# Patient Record
Sex: Male | Born: 1960 | Race: White | Hispanic: No | Marital: Married | Smoking: Former smoker
Health system: Southern US, Community
[De-identification: ages and names within clinical notes are randomized; demographics above are authoritative.]

---

## 2001-05-30 ENCOUNTER — Encounter: Admission: RE | Admit: 2001-05-30 | Discharge: 2001-05-30 | Payer: Self-pay | Admitting: Neurosurgery

## 2001-05-30 ENCOUNTER — Encounter: Payer: Self-pay | Admitting: Neurosurgery

## 2013-09-11 ENCOUNTER — Ambulatory Visit: Payer: Self-pay

## 2013-12-03 ENCOUNTER — Ambulatory Visit: Payer: Self-pay | Admitting: Surgery

## 2013-12-03 LAB — BASIC METABOLIC PANEL
Anion Gap: 6 — ABNORMAL LOW (ref 7–16)
BUN: 13 mg/dL (ref 7–18)
Calcium, Total: 8.8 mg/dL (ref 8.5–10.1)
Chloride: 108 mmol/L — ABNORMAL HIGH (ref 98–107)
Co2: 25 mmol/L (ref 21–32)
Creatinine: 0.91 mg/dL (ref 0.60–1.30)
EGFR (African American): >60
EGFR (Non-African Amer.): >60
Glucose: 122 mg/dL — ABNORMAL HIGH (ref 65–99)
Osmolality: 279 (ref 275–301)
Potassium: 3.8 mmol/L (ref 3.5–5.1)
Sodium: 139 mmol/L (ref 136–145)

## 2013-12-03 LAB — HEPATIC FUNCTION PANEL A (ARMC)
Albumin: 3.8 g/dL (ref 3.4–5.0)
Alkaline Phosphatase: 46 U/L
Bilirubin, Direct: <0.1 mg/dL (ref 0.00–0.20)
Bilirubin,Total: 0.4 mg/dL (ref 0.2–1.0)
SGOT(AST): 24 U/L (ref 15–37)
SGPT (ALT): 36 U/L (ref 12–78)
Total Protein: 6.6 g/dL (ref 6.4–8.2)

## 2013-12-03 LAB — CBC WITH DIFFERENTIAL/PLATELET
Basophil #: 0 10*3/uL (ref 0.0–0.1)
Basophil %: 0.6 %
Eosinophil #: 0.1 10*3/uL (ref 0.0–0.7)
Eosinophil %: 1.7 %
HCT: 43 % (ref 40.0–52.0)
HGB: 14.7 g/dL (ref 13.0–18.0)
Lymphocyte #: 1 10*3/uL (ref 1.0–3.6)
Lymphocyte %: 15.4 %
MCH: 29.2 pg (ref 26.0–34.0)
MCHC: 34.1 g/dL (ref 32.0–36.0)
MCV: 86 fL (ref 80–100)
Monocyte #: 0.4 x10 3/mm (ref 0.2–1.0)
Monocyte %: 6.2 %
Neutrophil #: 4.8 10*3/uL (ref 1.4–6.5)
Neutrophil %: 76.1 %
Platelet: 205 10*3/uL (ref 150–440)
RBC: 5.02 10*6/uL (ref 4.40–5.90)
RDW: 13.3 % (ref 11.5–14.5)
WBC: 6.4 10*3/uL (ref 3.8–10.6)

## 2013-12-03 LAB — PROTIME-INR
INR: 1
Prothrombin Time: 12.6 secs (ref 11.5–14.7)

## 2013-12-10 ENCOUNTER — Ambulatory Visit: Payer: Self-pay | Admitting: Surgery

## 2013-12-12 ENCOUNTER — Other Ambulatory Visit: Payer: Self-pay | Admitting: Surgery

## 2013-12-12 LAB — ALBUMIN: Albumin: 3.6 g/dL (ref 3.4–5.0)

## 2013-12-12 LAB — CALCIUM: Calcium, Total: 8.6 mg/dL (ref 8.5–10.1)

## 2013-12-19 ENCOUNTER — Other Ambulatory Visit: Payer: Self-pay | Admitting: Surgery

## 2013-12-19 LAB — PATHOLOGY REPORT

## 2013-12-19 LAB — CALCIUM: Calcium, Total: 9.3 mg/dL (ref 8.5–10.1)

## 2014-03-08 DIAGNOSIS — E210 Primary hyperparathyroidism: Secondary | ICD-10-CM | POA: Insufficient documentation

## 2015-03-01 NOTE — Op Note (Signed)
PATIENT NAME:  John Hahn, [name] MR#:  [id number] DATE OF BIRTH:  06-Oct-1961  DATE OF PROCEDURE:  12/10/2013  PREOPERATIVE DIAGNOSIS: Primary hyperparathyroidism from right inferior parathyroid adenoma.  POSTOPERATIVE DIAGNOSIS: Primary hyperparathyroidism from right inferior parathyroid adenoma (1700 mg).   PROCEDURE: Right inferior parathyroidectomy.   SURGEON: Claude MangesWilliam F. Sorina Derrig, MD  FIRST ASSISTANT: Ida Roguehristopher Lundquist, MD  ANESTHESIA: General.   PROCEDURE IN DETAIL: The patient was placed supine on the operating room table and prepped and draped in the usual sterile fashion. A limited incision was made over the anterior border of the sternocleidomastoid muscle, in the neck crease, 2 fingerbreadths above the right clavicle, and this was carried down through the platysma muscle with electrocautery. Subplatysmal flaps were created superiorly and inferiorly and then the anterior border of the sternocleidomastoid was dissected and retracted laterally. The omohyoid was dissected and retracted superiorly, and the lateral edge of the strap muscles was dissected and retracted medially exposing the parathyroid adenoma. It was quite large and extremely firm. There was a significant amount of scar tissue surrounding the entire gland. The gland was attached to the lower pole of the thyroid gland, and the scar tissue emanated in all directions. The gland was tediously dissected out from its location and small hemoclips were placed on individual vessels. The right recurrent laryngeal nerve was never expressly identified, but was not thought to have been injured during the dissection. Hemostasis was achieved at the end of the procedure and was excellent and therefore the platysma was closed with a running 3-0 Monocryl suture and the skin was reapproximated with a running subcuticular 5-0 Monocryl and suture strips. The pathology on the frozen section was a 1700 mg right parathyroid gland that looked like it had  incurred significant rupture and hemorrhage. This was likely due to the fine needle aspirate. The pathologist did not see anything concerning for parathyroid cancer on the initial frozen section, but was advised to consider this during the review of the permanent pathology.    ____________________________ Claude MangesWilliam F. Tinzlee Craker, MD wfm:sb D: 12/10/2013 11:48:21 ET T: 12/10/2013 14:00:31 ET JOB#: [id number]  cc: Claude MangesWilliam F. Naryiah Schley, MD, <Dictator> A. Wendall MolaMelissa Solum, MD Claude MangesWILLIAM F Brycin Kille MD ELECTRONICALLY SIGNED 12/10/2013 16:53

## 2016-03-03 DIAGNOSIS — I10 Essential (primary) hypertension: Secondary | ICD-10-CM | POA: Insufficient documentation

## 2016-09-06 DIAGNOSIS — E119 Type 2 diabetes mellitus without complications: Secondary | ICD-10-CM | POA: Insufficient documentation

## 2016-10-11 ENCOUNTER — Other Ambulatory Visit: Payer: Self-pay | Admitting: Internal Medicine

## 2016-10-11 DIAGNOSIS — M5116 Intervertebral disc disorders with radiculopathy, lumbar region: Secondary | ICD-10-CM

## 2016-10-22 ENCOUNTER — Ambulatory Visit
Admission: RE | Admit: 2016-10-22 | Discharge: 2016-10-22 | Disposition: A | Discharge status: Home / Self Care | Payer: BC Managed Care – PPO | Source: Ambulatory Visit | Attending: Internal Medicine | Admitting: Internal Medicine

## 2016-10-22 DIAGNOSIS — M4316 Spondylolisthesis, lumbar region: Secondary | ICD-10-CM | POA: Diagnosis not present

## 2016-10-22 DIAGNOSIS — M5116 Intervertebral disc disorders with radiculopathy, lumbar region: Secondary | ICD-10-CM | POA: Diagnosis present

## 2016-10-22 DIAGNOSIS — M48061 Spinal stenosis, lumbar region without neurogenic claudication: Secondary | ICD-10-CM | POA: Diagnosis not present

## 2016-12-07 DIAGNOSIS — M5136 Other intervertebral disc degeneration, lumbar region: Secondary | ICD-10-CM | POA: Insufficient documentation

## 2017-02-02 DIAGNOSIS — Z8619 Personal history of other infectious and parasitic diseases: Secondary | ICD-10-CM | POA: Insufficient documentation

## 2017-08-11 IMAGING — MR MR LUMBAR SPINE W/O CM
4 of 5 series · 23 of 48 positions shown · non-contrast
Comparison: Report of [REDACTED] lumbar radiographs 10/11/2016
(no images available).

CLINICAL DATA: 55-year-old male with lumbar back pain radiating to
the right lower extremity and foot. Burning tingling numbness and
leg weakness. Symptoms for 2 months. Initial encounter.

EXAM:
MRI LUMBAR SPINE WITHOUT CONTRAST
TECHNIQUE: Multiplanar, multisequence MR imaging of the lumbar spine was
performed. No intravenous contrast was administered.

[Series 2: T2 · sagittal · 4.0mm · 0.84mm/px · 7 of 17 slices shown (1 of 2)]
[im 1/17]
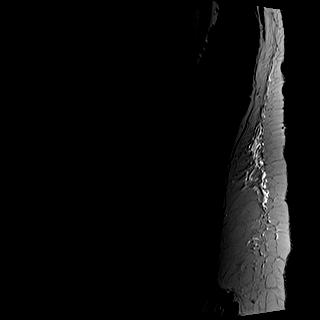
[im 3/17]
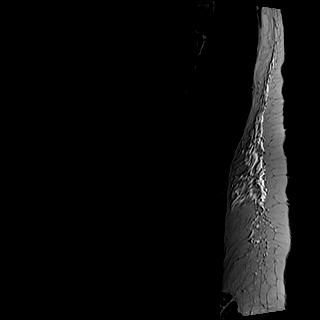
[im 6/17]
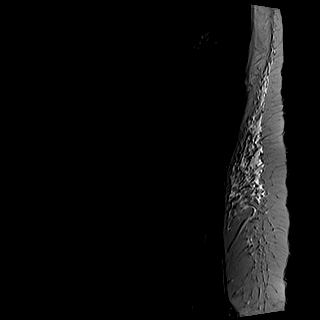
[im 9/17]
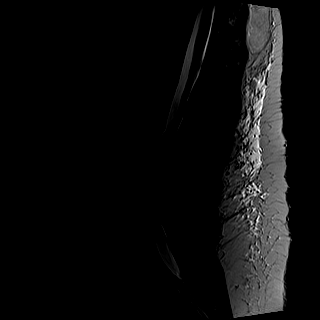
[im 11/17]
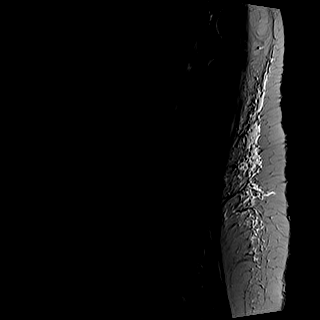
[im 14/17]
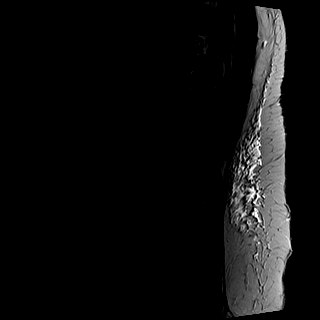
[im 17/17]
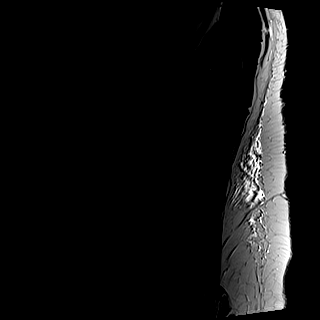

[Series 3: T1 · sagittal · 4.0mm · 0.42mm/px · 5 of 17 slices shown (1 of 2)]
[im 1/17]
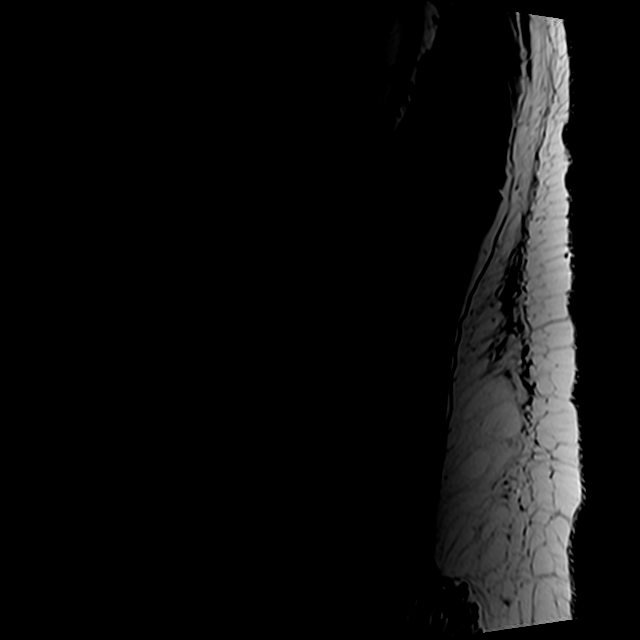
[im 3/17]
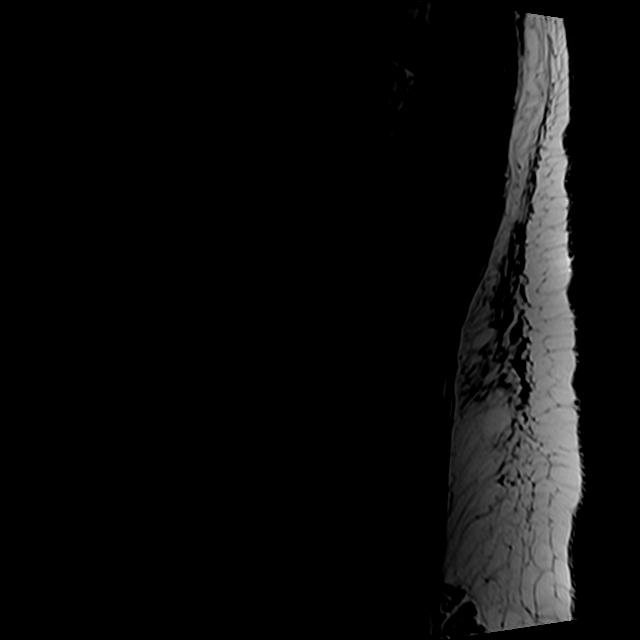
[im 6/17]
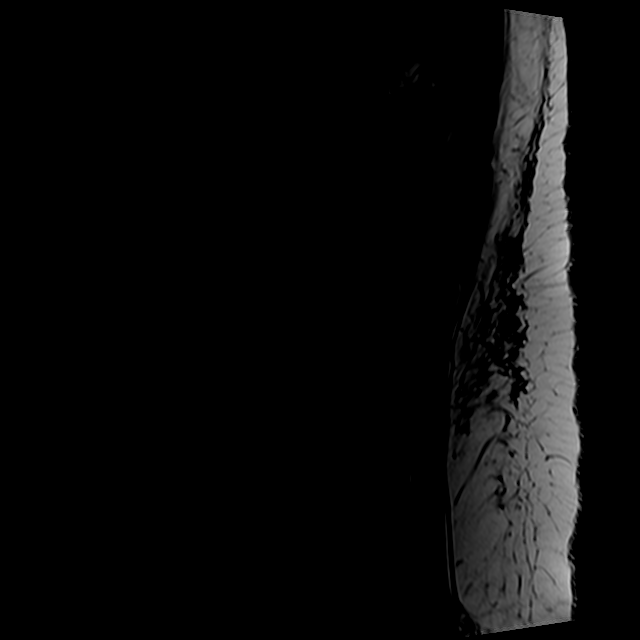
[im 9/17]
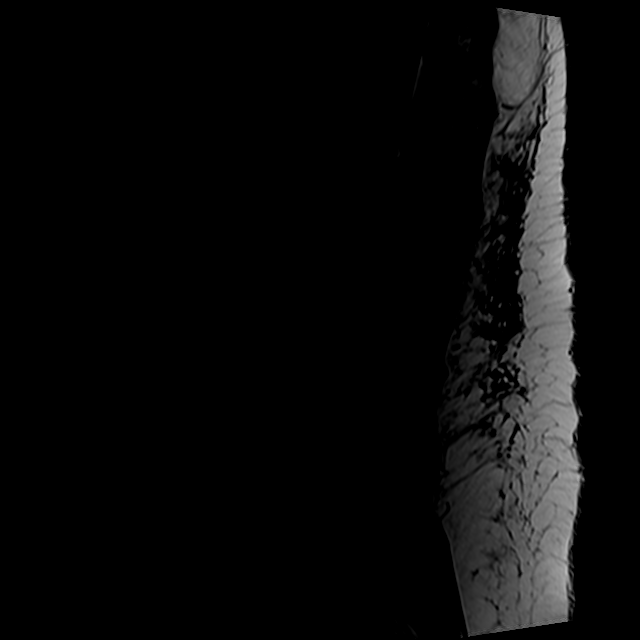
[im 14/17]
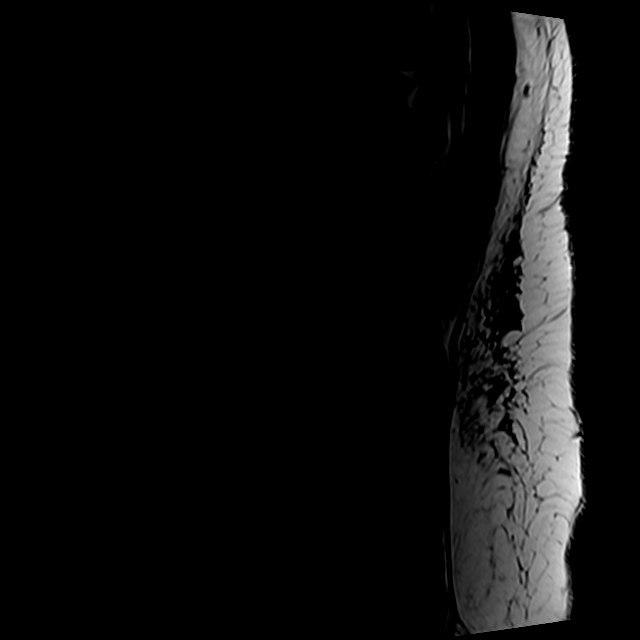

[Series 5: T2 · axial · 4.0mm · 0.78mm/px · z∈[-22,+189]mm · 8 of 38 slices shown (2 of 2)]
[im 1/38]
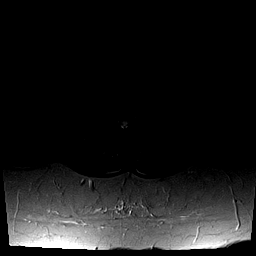
[im 6/38]
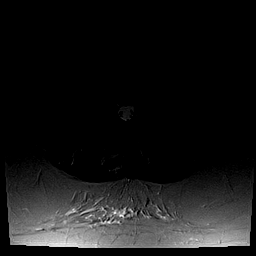
[im 12/38]
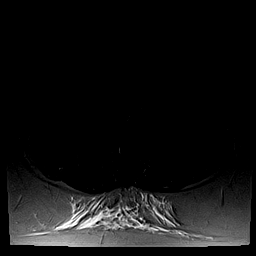
[im 18/38]
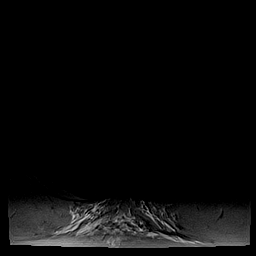
[im 20/38]
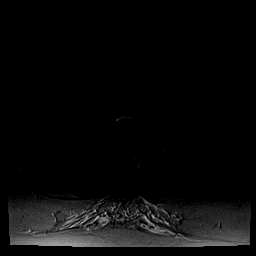
[im 26/38]
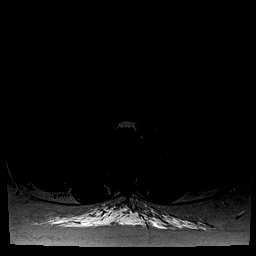
[im 32/38]
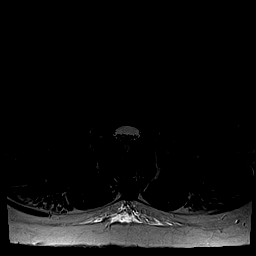
[im 38/38]
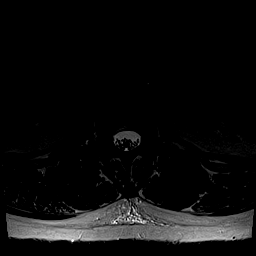

[Series 6: T1 · axial · 4.0mm · 0.31mm/px · z∈[+2,+160]mm · 3 of 38 slices shown (2 of 2)]
[im 6/38]
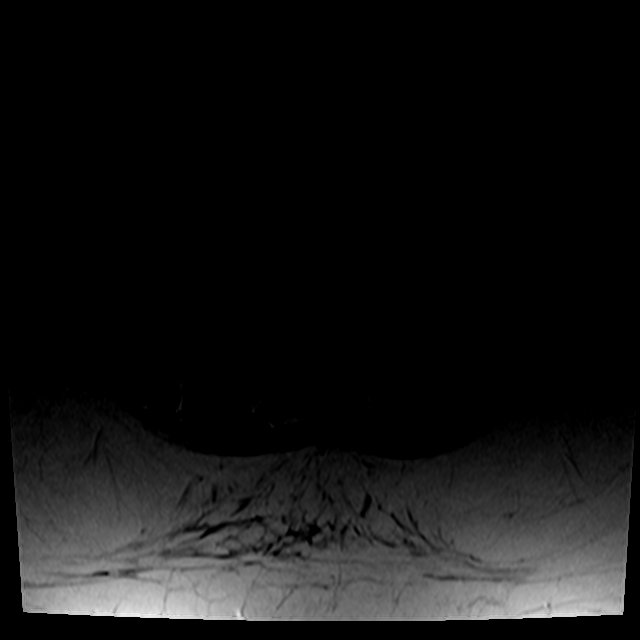
[im 20/38]
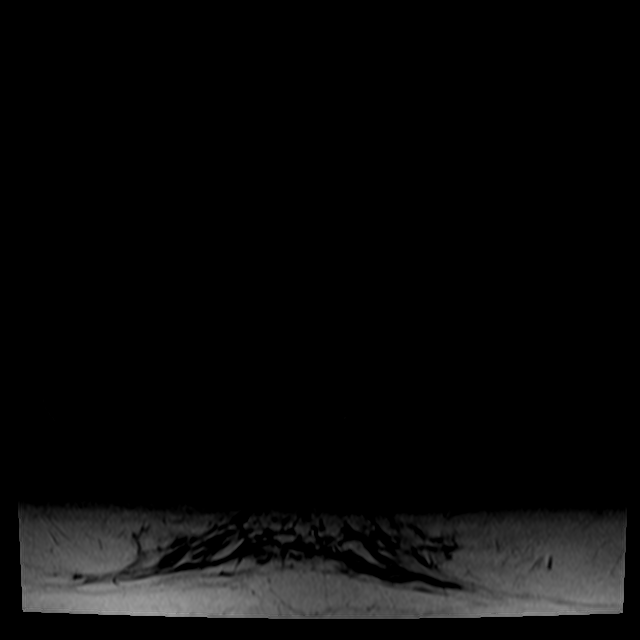
[im 32/38]
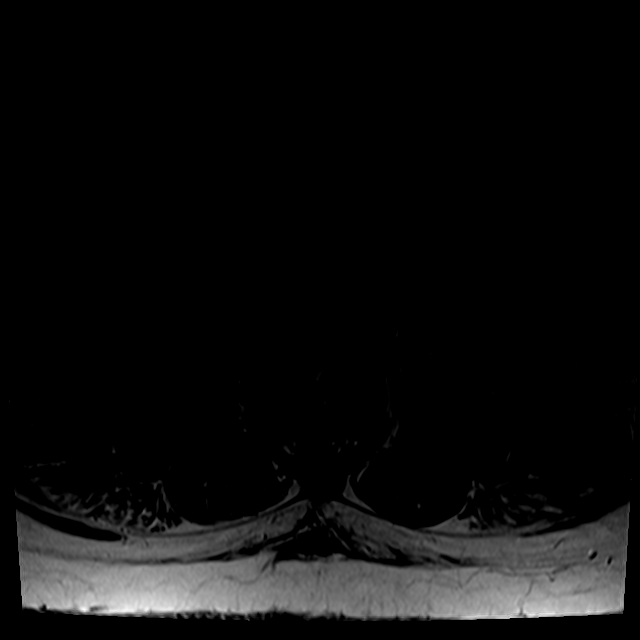

[23 of 48 positions shown; findings below may reference images not displayed]

FINDINGS: Segmentation: Lumbar segmentation appears to be normal and will be
designated as such for this report.

Alignment: Mild grade 1 anterolisthesis of L4 on L5. Trace
retrolisthesis of L3 on L4. Mild straightening of upper lumbar
lordosis.

Vertebrae: No marrow edema or evidence of acute osseous abnormality.
Negative visible sacrum and SI joints.

Conus medullaris: Extends to the L1 level and appears normal.

Paraspinal and other soft tissues: Negative.

Disc levels:

T12-L1:  Negative.

L1-L2:  Negative.

L2-L3: Disc desiccation. Circumferential disc bulge, mostly far
lateral. Mild facet hypertrophy. Trace facet joint fluid. Mild
bilateral L2 foraminal stenosis without significant spinal or
lateral recess stenosis.

L3-L4: Disc desiccation. Bulky circumferential disc bulge,
especially left far laterally, with endplate spurring. Broad-based
posterior component. Mild to moderate facet and ligament flavum
hypertrophy. Epidural lipomatosis. Moderate to severe spinal
stenosis (series 5, image 20) with bilateral lateral recess
stenosis. Moderate to severe left and moderate right L3 foraminal
stenosis.

L4-L5: Bulky right eccentric circumferential disc bulge. Broad-based
posterior component. Moderate to severe facet and ligament flavum
hypertrophy. Epidural lipomatosis. Severe spinal stenosis with right
greater than left lateral recess stenosis (series 5, image 27). Mild
to moderate left and moderate to severe right L4 foraminal stenosis.

L5-S1: Mild far lateral disc bulging and endplate spurring greater
on the left. Mild facet hypertrophy. No spinal or lateral recess
stenosis. Mild left L5 foraminal stenosis.
IMPRESSION: 1. Mild grade 1 anterolisthesis at L4-L5 with bulky right eccentric
disc protrusion and moderate to severe posterior element
hypertrophy. Subsequent Severe spinal and right lateral recess
stenosis (descending right L5 nerve root level) with moderate to
severe right L4 foraminal stenosis.
2. Mild retrolisthesis at L3-L4 with multifactorial moderate to
severe spinal stenosis, bilateral lateral recess stenosis, and left
greater than right L3 foraminal stenosis.
3. Mild disc and facet degeneration at L2-L3. Mild mostly left side
L5-S1 degeneration.

## 2017-09-05 ENCOUNTER — Ambulatory Visit: Payer: Self-pay | Admitting: Physician Assistant

## 2017-09-05 DIAGNOSIS — Z0289 Encounter for other administrative examinations: Secondary | ICD-10-CM

## 2017-09-05 NOTE — Progress Notes (Signed)
Here for sheriffs physical, see documentation in systoc

## 2017-10-19 ENCOUNTER — Ambulatory Visit: Payer: Self-pay | Admitting: Physician Assistant

## 2017-10-19 ENCOUNTER — Encounter: Payer: Self-pay | Admitting: Physician Assistant

## 2017-10-19 VITALS — BP 140/90 | HR 80 | Temp 98.0°F | Resp 16

## 2017-10-19 DIAGNOSIS — J0120 Acute ethmoidal sinusitis, unspecified: Secondary | ICD-10-CM

## 2017-10-19 NOTE — Progress Notes (Signed)
   Subjective: Sinus congestion     Patient ID: John Hahn, male    DOB: [birth year], 56 y.o.   MRN: [id number]  HPI Patient complaining of 2 weeks of sinus congestion, facial pain, postnasal drainage, and coughing. Patient said complaint worsens at night and landed down. Patient denies fever/chills or nausea, no vomiting, diarrhea. Patient state no flu shot this season. Patient stated no relief over-the-counter preparations.   Review of Systems Negative except for complaint    Objective:   Physical Exam HEENT remarkable for edematous bilaterally nasal turbinates with thick greenish rhinorrhea. Bilateral maxillary guarding. Erythematous pharynx secondary to postnasal drainage. Neck is supple without adenopathy.. Lungs have bilateral Rales and increased cough with deep inspirations. Heart is regular rate and rhythm.       Assessment & Plan: Sinusitis and bronchitis   Patient given prescription for Bactrim DS, Allegra-D, Tessalon Perles, and Tussionex to be taken only at night. Patient advised follow-up PCP if no improvement in 5-7 days.

## 2017-12-23 ENCOUNTER — Ambulatory Visit: Payer: Self-pay | Admitting: Physician Assistant

## 2017-12-23 ENCOUNTER — Encounter: Payer: Self-pay | Admitting: Physician Assistant

## 2017-12-23 VITALS — BP 138/100 | HR 80 | Temp 97.7°F

## 2017-12-23 DIAGNOSIS — H1010 Acute atopic conjunctivitis, unspecified eye: Secondary | ICD-10-CM

## 2017-12-23 NOTE — Progress Notes (Signed)
   Subjective: Cough and redness to eyes.    Patient ID: John Hahn, male    DOB: 1961/09/04, 57 y.o.   MRN: [id number]  HPI Patient complained of nonproductive cough for greater than a week.  Patient is a cough increases at night with laying down.  Patient also has a concern of redness to right eye.  Patient stated wear contact lenses and 30 mother had eye infection so removed the contact lenses and use some leftover eyedrops.  Patient stated mild relief after 4 days of using the left lower eyedrops for redness return at the reinsertion of contact lenses.  Patient denies other vision disturbance.  Patient denies fever or chills associated with his cough.  Patient denies nausea, vomiting, diarrhea.  Patient has taken flu shot for this season.   Review of Systems    Negative except for complaint Objective:   Physical Exam HEENT remarkable bilateral erythema with no visible discharge from the eyes.  No matted eyelids.  Neck was supple for adenopathy.  Lungs are clear to auscultation however increased cough with deep inspirations.  Heart was regular rate and rhythm.      Assessment & Plan: Conjunctivitis/cough  Patient advised to continue to contact lenses.  Patient given a prescription for gentamicin eyedrops, Tessalon Perles, and Tussionex.  Patient advised to follow-up PCP if no improvement in 3 days.

## 2018-06-28 DIAGNOSIS — Z8249 Family history of ischemic heart disease and other diseases of the circulatory system: Secondary | ICD-10-CM | POA: Insufficient documentation

## 2018-06-28 DIAGNOSIS — E782 Mixed hyperlipidemia: Secondary | ICD-10-CM | POA: Insufficient documentation

## 2018-10-17 ENCOUNTER — Ambulatory Visit: Payer: Self-pay | Admitting: Emergency Medicine

## 2018-10-17 ENCOUNTER — Encounter: Payer: Self-pay | Admitting: Emergency Medicine

## 2018-10-17 VITALS — BP 148/84 | HR 82 | Temp 98.2°F | Resp 12

## 2018-10-17 DIAGNOSIS — Z0000 Encounter for general adult medical examination without abnormal findings: Secondary | ICD-10-CM

## 2018-10-17 DIAGNOSIS — S91332A Puncture wound without foreign body, left foot, initial encounter: Secondary | ICD-10-CM

## 2018-10-17 MED ORDER — AMOXICILLIN-POT CLAVULANATE 875-125 MG PO TABS: 1.0000 | ORAL_TABLET | Freq: Two times a day (BID) | ORAL | 0 refills | Status: DC

## 2018-10-17 NOTE — Patient Instructions (Addendum)
Puncture wound left foot was cleaned and irrigated today.  No foreign body seen. Polysporin dressing.  Keep wound clean and dry for the next 2 days. Of sent a prescription for Augmentin twice a day for 7-10 days to your pharmacy.  This is a strong antibiotic to cover germs that are in puncture wounds and also to help cover staph. Tetanus shot given today. Return if any signs of infection. Puncture Wound A puncture wound is an injury that is caused by a sharp, thin object that goes through (penetrates) your skin. Usually, a puncture wound does not leave a large opening in your skin, so it may not bleed a lot. However, when you get a puncture wound, dirt or other materials (foreign bodies) can be forced into your wound and break off inside. This increases the chance of infection, such as tetanus. What are the causes? Puncture wounds are caused by any sharp, thin object that goes through your skin, such as:  Animal teeth, as with an animal bite.  Sharp, pointed objects, such as nails, splinters of glass, fishhooks, and needles.  What are the signs or symptoms? Symptoms of a puncture wound include:  Pain.  Bleeding.  Swelling.  Bruising.  Fluid leaking from the wound.  Numbness, tingling, or loss of function.  How is this diagnosed? This condition is diagnosed with a medical history and physical exam. Your wound will be checked to see if it contains any foreign bodies. You may also have X-rays or other imaging tests. How is this treated? Treatment for a puncture wound depends on how serious the wound is. It also depends on whether the wound contains any foreign bodies. Treatment for all types of puncture wounds usually starts with:  Controlling the bleeding.  Washing out the wound with a germ-free (sterile) salt-water solution.  Checking the wound for foreign bodies.  Treatment may also include:  Having the wound opened surgically to remove a foreign object.  Closing the wound  with stitches (sutures) if it continues to bleed.  Covering the wound with antibiotic ointments and a bandage (dressing).  Receiving a tetanus shot.  Receiving a rabies vaccine.  Follow these instructions at home: Medicines  Take or apply over-the-counter and prescription medicines only as told by your health care provider.  If you were prescribed an antibiotic, take or apply it as told by your health care provider. Do not stop using the antibiotic even if your condition improves. Wound care  There are many ways to close and cover a wound. For example, a wound can be covered with sutures, skin glue, or adhesive strips.Follow instructions from your health care provider about: ? How to take care of your wound. ? When and how you should change your dressing. ? When you should remove your dressing. ? Removing whatever was used to close your wound.  Keep the dressing dry as told by your health care provider. Do not take baths, swim, use a hot tub, or do anything that would put your wound underwater until your health care provider approves.  Clean the wound as told by your health care provider.  Do not scratch or pick at the wound.  Check your wound every day for signs of infection. Watch for: ? Redness, swelling, or pain. ? Fluid, blood, or pus. General instructions  Raise (elevate) the injured area above the level of your heart while you are sitting or lying down.  If your puncture wound is in your foot, ask your health care provider  if you need to avoid putting weight on your foot and for how long.  Keep all follow-up visits as told by your health care provider. This is important. Contact a health care provider if:  You received a tetanus shot and you have swelling, severe pain, redness, or bleeding at the injection site.  You have a fever.  Your sutures come out.  You notice a bad smell coming from your wound or your dressing.  You notice something coming out of your  wound, such as wood or glass.  Your pain is not controlled with medicine.  You have increased redness, swelling, or pain at the site of your wound.  You have fluid, blood, or pus coming from your wound.  You notice a change in the color of your skin near your wound.  You need to change the dressing frequently due to fluid, blood, or pus draining from your wound.  You develop a new rash.  You develop numbness around your wound. Get help right away if:  You develop severe swelling around your wound.  Your pain suddenly increases and is severe.  You develop painful skin lumps.  You have a red streak going away from your wound.  The wound is on your hand or foot and you cannot properly move a finger or toe.  The wound is on your hand or foot and you notice that your fingers or toes look pale or bluish. This information is not intended to replace advice given to you by your health care provider. Make sure you discuss any questions you have with your health care provider. Document Released: 08/04/2005 Document Revised: 03/29/2016 Document Reviewed: 12/18/2014 Elsevier Interactive Patient Education  [employer].

## 2018-10-17 NOTE — Progress Notes (Signed)
[facility]   Patient ID: Wendee CoppJames C Widger DOB: 57 y.o. MRN: [id number]  Chief complaint: Puncture wound left foot Subjective: Last night around 10 PM while at home, he accidentally stepped on a Christmas ornament and cut plantar aspect foot.  He irrigated and cleaned at home and he removed a small metallic ornamental foreign body.  No definite foreign body sensation now, but it feels sore.  No numbness or weakness. Last tetanus shot over 10 years ago. Past medical history of controlled type 2 diabetes and hypertension. He did have a cellulitis from a wound left lower leg last year, treated effectively with antibiotic at the time.  Pertinent items noted in HPI and remainder of comprehensive ROS otherwise negative.   Physical Exam  Musculoskeletal:       Left foot: There is laceration (Puncture wound, plantar aspect). There is no bony tenderness and normal capillary refill.       Feet:     Blood pressure (!) 148/84, pulse 82, temperature 98.2 F (36.8 C), temperature source Oral, resp. rate 12, SpO2 97 %. Alert, pleasant male no distress. Heart: Regular rate Lungs, equal expansion Puncture wound left foot, described above.  No drainage or pus or red streaks or redness or heat.   Assessment: Partial skin thickness puncture wound plantar aspect left foot. No foreign body seen or felt.    Plan: Polysporin ointment , dressing applied. Wound care discussed. DTaP given to update tetanus shot. Prescribed Augmentin for anaerobic, staph, and gram-negative coverage. Advised to watch for any sign of infection and seek medical care immediately if any sign of infection.   New Prescriptions   AMOXICILLIN-CLAVULANATE (AUGMENTIN) 875-125 MG TABLET    Take 1 tablet by mouth 2 (two) times daily. For 10 days. Take with food.    An After Visit Summary was printed and given to the patient. Reviewed instructions and AVS with patient.  Follow-up with  your primary care doctor in 5-7 days if not improving, or sooner if symptoms become worse. Precautions discussed. Red flags discussed. Questions invited and answered. Patient voiced understanding and agreement.

## 2018-11-02 ENCOUNTER — Encounter: Payer: Self-pay | Admitting: Emergency Medicine

## 2018-11-02 ENCOUNTER — Ambulatory Visit: Payer: Self-pay | Admitting: Emergency Medicine

## 2018-11-02 VITALS — BP 158/98 | HR 70 | Temp 97.7°F | Resp 14

## 2018-11-02 DIAGNOSIS — L089 Local infection of the skin and subcutaneous tissue, unspecified: Secondary | ICD-10-CM

## 2018-11-02 DIAGNOSIS — S91332D Puncture wound without foreign body, left foot, subsequent encounter: Secondary | ICD-10-CM

## 2018-11-02 MED ORDER — SULFAMETHOXAZOLE-TRIMETHOPRIM 800-160 MG PO TABS: 1.0000 | ORAL_TABLET | Freq: Three times a day (TID) | ORAL | 0 refills | Status: AC

## 2018-11-02 NOTE — Progress Notes (Signed)
[facility]   Patient ID: John CoppJames C Avery DOB: 57 y.o. MRN: [id number]   Subjective: Was seen here 10/17/2018 for partial skin thickness puncture wound plantar aspect of left foot, after stepping on a Christmas ornament.  No foreign body was felt or seen on exploration.  He was treated by cleaning wound, tetanus shot was updated, Polysporin ointment and dressing and wound care instructions and was prescribed Augmentin 875 twice daily.  Pros and cons of x-ray were discussed at that visit, and he declined any x-ray or imaging of his foot at that time.  At that visit, he was advised to watch for any sign of infection and seek medical care immediately if any worsening symptoms or red flags or if it did not improve.  He presents today because plantar aspect left foot pain at the wound site is worsening the past several days.  He is not sure if he feels a foreign body sensation but he has moderate to severe pain when weightbearing on left foot.  Denies numbness or weakness.  Denies drainage or bleeding.  Pertinent items noted in HPI and remainder of comprehensive ROS otherwise negative.   Of particular importance in his past medical history: -Type 2 diabetes -He had severe cellulitis left lower extremity June 2019, (treated elsewhere) requiring many weeks of antibiotic treatment to resolve it.-(Of note, he was treated elsewhere for that cellulitis with cephalexin, but adding Septra DS by his physician then seemed to work the best to ultimately resolve his cellulitis after many weeks)   I reviewed the details in his visit note of 10/17/2018    Objective: Blood pressure (!) 158/98, pulse 70, temperature 97.7 F (36.5 C), temperature source Oral, resp. rate 14, SpO2 97 %.  Physical Exam Vitals signs reviewed.  Constitutional:      General: He is not in acute distress.    Appearance: He is well-developed.  HENT:     Head: Normocephalic and atraumatic.    Eyes:     General: No scleral icterus.    Pupils: Pupils are equal, round, and reactive to light.  Neck:     Musculoskeletal: Normal range of motion and neck supple.  Cardiovascular:     Rate and Rhythm: Normal rate and regular rhythm.  Pulmonary:     Effort: Pulmonary effort is normal.  Abdominal:     General: There is no distension.  Musculoskeletal:       Feet:     Comments: Left foot: See depiction with comments. Toes are nontender with normal capillary refill.  Sensation intact.  No definite bony tenderness.  Skin:    General: Skin is warm and dry.  Neurological:     Mental Status: He is alert and oriented to person, place, and time.     Cranial Nerves: No cranial nerve deficit.  Psychiatric:        Behavior: Behavior normal.       Assessment: 16 days status post puncture wound left plantar foot (after stepping on a Christmas ornament).  See details in history above. In my opinion, the wound is infected and I also suspect residual foreign body.  Plan: Work-up and treatment options discussed at length.  I advised ordering stat x-ray left foot at the hospital radiology office (there is no x-ray in this facility).  After discussion with him, he declined stat x-ray left foot today.  -Discussed at length.  I explained my deep concern about foot infection in someone who is diabetic.  And explained risks of not doing aggressive treatment within the next 24 hours.  We both agreed that it is prudent to do the following: #1. Start rx for Septra DS 3 times daily (usual dose BID, but I am using higher dose because potentially more serious infection, and covering possible MRSA)  New Prescriptions   SULFAMETHOXAZOLE-TRIMETHOPRIM (BACTRIM DS,SEPTRA DS) 800-160 MG TABLET.   #30, no refills    Take 1 tablet by mouth 3 (three) times daily for 10 days.    #2  Refer to podiatric specialist/surgeon for specialty evaluation and possible surgical exploration/treatment of left foot.  I  personally called and made appt with Dr. Gala LewandowskyBrent Evans, podiatric specialist/surgeon at [facility] office for tomorrow, 11/03/2018 at 7:45 AM. Office location: [address] [place], [place] [address].  Phone [phone].  I gave patient written instructions regarding today's plan and written instructions/details of referral appointment.  I stressed the vital importance of him keeping that appointment and risks of not keeping appointment, including worsening of foot pain, infection, or even risk of amputation or death.  Patient voiced understanding and agreement.

## 2018-11-03 ENCOUNTER — Other Ambulatory Visit: Payer: Self-pay | Admitting: Podiatry

## 2018-11-03 ENCOUNTER — Ambulatory Visit (INDEPENDENT_AMBULATORY_CARE_PROVIDER_SITE_OTHER): Payer: Managed Care, Other (non HMO) | Admitting: Podiatry

## 2018-11-03 ENCOUNTER — Encounter: Payer: Self-pay | Admitting: Podiatry

## 2018-11-03 ENCOUNTER — Ambulatory Visit (INDEPENDENT_AMBULATORY_CARE_PROVIDER_SITE_OTHER): Payer: Managed Care, Other (non HMO)

## 2018-11-03 DIAGNOSIS — M795 Residual foreign body in soft tissue: Secondary | ICD-10-CM | POA: Diagnosis not present

## 2018-11-03 DIAGNOSIS — L02612 Cutaneous abscess of left foot: Secondary | ICD-10-CM

## 2018-11-03 DIAGNOSIS — R52 Pain, unspecified: Secondary | ICD-10-CM

## 2018-11-05 LAB — WOUND CULTURE: Organism ID, Bacteria: NONE SEEN

## 2018-11-10 NOTE — Progress Notes (Signed)
   HPI: 58 year old male presents as a new patient today for evaluation of sharp stabbing pain in the left forefoot.  Patient states that about 3 weeks ago he stepped on a broken Christmas ornament.  He believes he got everything out however he felt like something may still be in there.  He was prescribed Bactrim DS by his primary care physician and recommended to follow-up here.  He presents for further treatment evaluation  No past medical history on file.   Physical Exam: General: The patient is alert and oriented x3 in no acute distress.  Dermatology: Skin is warm, dry and supple bilateral lower extremities. Negative for open lesions or macerations around the area where the patient stepped on the Christmas ornament  Vascular: Palpable pedal pulses bilaterally. No edema or erythema noted. Capillary refill within normal limits.  Neurological: Epicritic and protective threshold grossly intact bilaterally.   Musculoskeletal Exam: Range of motion within normal limits to all pedal and ankle joints bilateral. Muscle strength 5/5 in all groups bilateral.  Significant amount of pain on palpation around the area where the patient stepped on the Christmas ornament.  There does appear to be some callused area and there is a localized abscess with serous drainage upon debridement.  Upon presentation there is no open entry point or open wound prior to debridement.  Radiographic Exam:  Normal osseous mineralization. Joint spaces preserved. No fracture/dislocation/boney destruction.    Assessment: 1.  Abscess with foreign body glass shard left forefoot   Plan of Care:  1. Patient evaluated. X-Rays reviewed.  2.  Upon debridement of the area where the patient stepped on the broken please a glass there was serous drainage that came out.  There is also a glass shard approximately 6 mm in length that was removed from within the subcutaneous tissue of the forefoot.  Debridement was performed an incision and  drainage was performed as well.  Prior to debridement and incision and drainage the foot was prepped in aseptic manner.  Patient was able to tolerate the procedure without localized anesthesia infiltration 3.  Betadine ointment and dry sterile dressing was applied. 4.  Prior to ointment and dressings cultures were taken and sent to pathology for culture and sensitivity 5.  Continue Bactrim DS as per primary care physician 6.  Return to clinic in 2 weeks or as needed      Felecia Shelling, DPM [facility]  Dr. Felecia Shelling, DPM    [address] [place], [place] [address]                Office [phone]  Fax [phone]

## 2021-01-22 ENCOUNTER — Ambulatory Visit: Payer: Managed Care, Other (non HMO) | Admitting: Physician Assistant

## 2021-01-22 VITALS — BP 150/96 | HR 67 | Temp 97.9°F | Resp 15 | Ht 76.0 in | Wt 314.0 lb

## 2021-01-22 DIAGNOSIS — H1033 Unspecified acute conjunctivitis, bilateral: Secondary | ICD-10-CM | POA: Diagnosis not present

## 2021-01-22 MED ORDER — TOBRAMYCIN 0.3 % OP SOLN: 2.0000 [drp] | OPHTHALMIC | 0 refills | Status: AC

## 2021-01-22 NOTE — Patient Instructions (Signed)
Bacterial Conjunctivitis, Adult Bacterial conjunctivitis is an infection of the clear membrane that covers the white part of your eye and the inner surface of your eyelid (conjunctiva). When the blood vessels in your conjunctiva become inflamed, your eye becomes red or pink, and it will probably feel itchy. Bacterial conjunctivitis spreads very easily from person to person (is contagious). It also spreads easily from one eye to the other eye. What are the causes? This condition is caused by bacteria. You may get the infection if you come into close contact with:  A person who is infected with the bacteria.  Items that are contaminated with the bacteria, such as a face towel, contact lens solution, or eye makeup. What increases the risk? You are more likely to develop this condition if you:  Are exposed to other people who have the infection.  Wear contact lenses.  Have a sinus infection.  Have had a recent eye injury or surgery.  Have a weak body defense system (immune system).  Have a medical condition that causes dry eyes. What are the signs or symptoms? Symptoms of this condition include:  Thick, yellowish discharge from the eye. This may turn into a crust on the eyelid overnight and cause your eyelids to stick together.  Tearing or watery eyes.  Itchy eyes.  Burning feeling in your eyes.  Eye redness.  Swollen eyelids.  Blurred vision.   How is this diagnosed? This condition is diagnosed based on your symptoms and medical history. Your health care provider may also take a sample of discharge from your eye to find the cause of your infection. This is rarely done. How is this treated? This condition may be treated with:  Antibiotic eye drops or ointment to clear the infection more quickly and prevent the spread of infection to others.  Oral antibiotic medicines to treat infections that do not respond to drops or ointments or that last longer than 10 days.  Cool, wet  cloths (cool compresses) placed on the eyes.  Artificial tears applied 2-6 times a day.   Follow these instructions at home: Medicines  Take or apply your antibiotic medicine as told by your health care provider. Do not stop taking or applying the antibiotic even if you start to feel better.  Take or apply over-the-counter and prescription medicines only as told by your health care provider.  Be very careful to avoid touching the edge of your eyelid with the eye-drop bottle or the ointment tube when you apply medicines to the affected eye. This will keep you from spreading the infection to your other eye or to other people. Managing discomfort  Gently wipe away any drainage from your eye with a warm, wet washcloth or a cotton ball.  Apply a clean, cool compress to your eye for 10-20 minutes, 3-4 times a day. General instructions  Do not wear contact lenses until the inflammation is gone and your health care provider says it is safe to wear them again. Ask your health care provider how to sterilize or replace your contact lenses before you use them again. Wear glasses until you can resume wearing contact lenses.  Avoid wearing eye makeup until the inflammation is gone. Throw away any old eye cosmetics that may be contaminated.  Change or wash your pillowcase every day.  Do not share towels or washcloths. This may spread the infection.  Wash your hands often with soap and water. Use paper towels to dry your hands.  Avoid touching or rubbing your   eyes.  Do not drive or use heavy machinery if your vision is blurred. Contact a health care provider if:  You have a fever.  Your symptoms do not get better after 10 days. Get help right away if you have:  A fever and your symptoms suddenly get worse.  Severe pain when you move your eye.  Facial pain, redness, or swelling.  Sudden loss of vision. Summary  Bacterial conjunctivitis is an infection of the clear membrane that covers  the white part of your eye and the inner surface of your eyelid (conjunctiva).  Bacterial conjunctivitis spreads very easily from person to person (is contagious).  Wash your hands often with soap and water. Use paper towels to dry your hands.  Take or apply your antibiotic medicine as told by your health care provider. Do not stop taking or applying the antibiotic even if you start to feel better.  Contact a health care provider if you have a fever or your symptoms do not get better after 10 days. This information is not intended to replace advice given to you by your health care provider. Make sure you discuss any questions you have with your health care provider. Document Revised: 02/13/2019 Document Reviewed: 05/31/2018 Elsevier Patient Education  2021 [employer]  

## 2021-01-22 NOTE — Progress Notes (Signed)
Subjective:    Patient ID: John Hahn, male    DOB: Mar 08, 1961, 60 y.o.   MRN: [id number]  HPI  John Hahn is a 60 yo M with the [employer] Desk/ computer majority of time 2 day hx of mild irritation conjunctiva left eye Denies any sensation of foreign body Denies pain  Slept without interruption  Minimal clear discharge, no off-color Had mild cold last week with nasal discharge Uses hand sanitizer at desk frequently  Wears soft extended wear contacts and routinely uses longer than designated schedule, generally without issue. Yesterday noted itch and irritation left eye- opted to treat with OTC eye drops for irritation- no change noted Today left eye continues and now right eye also mildly irritated Wears glasses at home and now during day at computer Contacts are out today He reports vision unchanged from usual Up to date with Opthal- last exam about 6 months ago  Had Routine Annual exam with PCP last week-  Reports " got good reports for my age and my size" DM, HTN,     6 '4"   314 lb  150/96  Review of Systems As noted     Objective:   Physical Exam Vitals and nursing note reviewed.  Constitutional:      Comments: Mild distress , concern over eye irritation Denies pain,   6'4"  314 lb  HENT:     Head: Normocephalic and atraumatic.     Nose: Nose normal.     Mouth/Throat:     Mouth: Mucous membranes are moist.  Eyes:     Extraocular Movements: Extraocular movements intact.     Pupils: Pupils are equal, round, and reactive to light.     Comments: Mildly injected left eye , uniformly involving entire conjunctiva Right eye is also injected at a lesser level  No photophobia EOMI No discharge No pain Pt defers exam /stain-denies any sense of FB    Pulmonary:     Effort: Pulmonary effort is normal.  Musculoskeletal:        General: Normal range of motion.     Cervical back: Normal range of motion and neck supple.  Skin:    General: Skin is warm  and dry.     Capillary Refill: Capillary refill takes less than 2 seconds.  Neurological:     Mental Status: He is alert.  Psychiatric:        Mood and Affect: Mood normal.       Assessment & Plan:  Conjunctivitis, bilateral Tobrex solution 2 qtts q 4 h while awake  No contacts-  May be viral ( very common) with recent URI or possibly  related to dated lenses and high pollen counts locally, bacterial.  Discussed. Discard those lenses and use fresh soak kit future.  Will contact [facility] for re-check in 5 days- do not use contacts until f/u. Expect to cleanse more frequently until pollen season ends, after given clearance to use again  Discussed  HT WT ratio and ideas for dietary modification- healthy choices, portion control- add daily 30 minute walk . 15 minutes before lunch, final 15 before getting in car to leave. Has previously had John Hahn and recently renewed..encourage this to be in addition to daily brisk walks. He recognizes active salt use at the table and sodium rich food preferences....remove shaker from table. Wife cooks, encourage reviewing  DM information re choices and serving sizes together- walks together if pace maintained  Questions fielded, recommendations reviewed. Rx transmit  to [facility] for pick up FU PRN with questions or concerns

## 2021-06-04 ENCOUNTER — Encounter: Payer: Self-pay | Admitting: Physician Assistant

## 2021-06-04 ENCOUNTER — Telehealth: Payer: Managed Care, Other (non HMO) | Admitting: Physician Assistant

## 2021-06-04 DIAGNOSIS — R490 Dysphonia: Secondary | ICD-10-CM

## 2021-06-04 NOTE — Progress Notes (Signed)
   Subjective:    Patient ID: John Hahn, male    DOB: 1961/08/09, 60 y.o.   MRN: [id number]  HPI  TELEPHONE VISIT 60 yo M  calls with questions about intermittent hoarseness John Hahn  known to provider Advised ACG currently well-visit clinic -can provide telephone consult John Hahn readily accepts service.  Patient experienced Covid in 2020 before the advent of vaccines He then had vaccines 1 and 2 which were well tolerated. In late June 28,2022 he attended a conference for work - a number of participants developed symptoms of Covid. And he as well.  On  July 8 he contacted PCP Bethann Punches MD and was treated with Paxlovid and prednisone for scratchy throat, cough and fatigue. Medications well tolerated. He has had some mild/moderate persistent hoarseness which recently became of particular concern to his wife, though he feels symptoms are improving.  No fever or malaise, fatigue has subsided and he reports being back at full activity Call from work for this visit. Appetite normal.  Review of Systems As noted above    Objective:   Physical Exam Telephone visit- no in person exam Speaks in full sentences without hesitation Intermittent passages with raspy overtones, and clearing No cough Laughs and talks apparently comfortably    Assessment & Plan:  Tincture of time - observe and follow up if persistent through next month Warm salt water gargles, tea and honey, lemon lozenges and voice rest all entertained as option for intervention Encourage he proceed with Covid Booster and encourage the Flu vaccine as well with Fall  rapidly approaching. Please discuss with PCP for input as well.  Discussed the highly contagious Ba5 variant and encourage the use of masks whenever indoors or in proximity of others.  Conferences , as he experienced ,as well as any group gatherings, meetings need to be respected as potential virus sharing events. John Hahn expressed appreciation  for the time, conversation and attention of our telephone visit and agrees to be back in touch if symptoms persist for an extended period of time so we can re--evaluate a plan of care.

## 2024-03-08 ENCOUNTER — Ambulatory Visit: Payer: Self-pay

## 2024-03-08 DIAGNOSIS — Z1211 Encounter for screening for malignant neoplasm of colon: Secondary | ICD-10-CM

## 2024-03-08 DIAGNOSIS — K640 First degree hemorrhoids: Secondary | ICD-10-CM | POA: Diagnosis not present

## 2024-03-08 DIAGNOSIS — K5730 Diverticulosis of large intestine without perforation or abscess without bleeding: Secondary | ICD-10-CM | POA: Diagnosis not present
